# Patient Record
Sex: Female | Born: 1985 | Race: Black or African American | Hispanic: No | Marital: Single | Smoking: Never smoker
Health system: Southern US, Community
[De-identification: ages and names within clinical notes are randomized; demographics above are authoritative.]

---

## 2013-05-16 ENCOUNTER — Encounter (HOSPITAL_COMMUNITY): Payer: Self-pay | Admitting: Emergency Medicine

## 2013-05-16 ENCOUNTER — Emergency Department (HOSPITAL_COMMUNITY)
Admission: EM | Admit: 2013-05-16 | Discharge: 2013-05-16 | Disposition: A | Discharge status: Home / Self Care | Payer: Self-pay | Attending: Emergency Medicine | Admitting: Emergency Medicine

## 2013-05-16 ENCOUNTER — Emergency Department (HOSPITAL_COMMUNITY): Payer: Self-pay

## 2013-05-16 DIAGNOSIS — S8990XA Unspecified injury of unspecified lower leg, initial encounter: Secondary | ICD-10-CM | POA: Insufficient documentation

## 2013-05-16 DIAGNOSIS — M79671 Pain in right foot: Secondary | ICD-10-CM

## 2013-05-16 DIAGNOSIS — S99919A Unspecified injury of unspecified ankle, initial encounter: Principal | ICD-10-CM

## 2013-05-16 DIAGNOSIS — Y939 Activity, unspecified: Secondary | ICD-10-CM | POA: Insufficient documentation

## 2013-05-16 DIAGNOSIS — Y929 Unspecified place or not applicable: Secondary | ICD-10-CM | POA: Insufficient documentation

## 2013-05-16 DIAGNOSIS — S99929A Unspecified injury of unspecified foot, initial encounter: Principal | ICD-10-CM

## 2013-05-16 DIAGNOSIS — IMO0002 Reserved for concepts with insufficient information to code with codable children: Secondary | ICD-10-CM | POA: Insufficient documentation

## 2013-05-16 MED ORDER — IBUPROFEN 800 MG PO TABS: 800.0000 mg | ORAL_TABLET | Freq: Three times a day (TID) | ORAL | Status: AC | PRN

## 2013-05-16 MED ORDER — IBUPROFEN 800 MG PO TABS
800.0000 mg | ORAL_TABLET | Freq: Once | ORAL | Status: AC
  Administered 2013-05-16: 800 mg via ORAL
  Filled 2013-05-16: qty 1
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
## 2013-05-16 NOTE — Discharge Instructions (Signed)
Read the information below.  Use the prescribed medication as directed.  Please discuss all new medications with your pharmacist.  You may return to the Emergency Department at any time for worsening condition or any new symptoms that concern you.  If there is any possibility that you might be pregnant, please let your health care provider know and discuss this with the pharmacist to ensure medication safety.  If you develop uncontrolled pain, weakness or numbness of the extremity, severe discoloration of the skin, or you are unable to walk or move your foot, return to the ER for a recheck.      Musculoskeletal Pain Musculoskeletal pain is muscle and boney aches and pains. These pains can occur in any part of the body. Your caregiver may treat you without knowing the cause of the pain. They may treat you if blood or urine tests, X-rays, and other tests were normal.  CAUSES There is often not a definite cause or reason for these pains. These pains may be caused by a type of germ (virus). The discomfort may also come from overuse. Overuse includes working out too hard when your body is not fit. Boney aches also come from weather changes. Bone is sensitive to atmospheric pressure changes. HOME CARE INSTRUCTIONS   Ask when your test results will be ready. Make sure you get your test results.  Only take over-the-counter or prescription medicines for pain, discomfort, or fever as directed by your caregiver. If you were given medications for your condition, do not drive, operate machinery or power tools, or sign legal documents for 24 hours. Do not drink alcohol. Do not take sleeping pills or other medications that may interfere with treatment.  Continue all activities unless the activities cause more pain. When the pain lessens, slowly resume normal activities. Gradually increase the intensity and duration of the activities or exercise.  During periods of severe pain, bed rest may be helpful. Lay or sit in  any position that is comfortable.  Putting ice on the injured area.  Put ice in a bag.  Place a towel between your skin and the bag.  Leave the ice on for 15 to 20 minutes, 3 to 4 times a day.  Follow up with your caregiver for continued problems and no reason can be found for the pain. If the pain becomes worse or does not go away, it may be necessary to repeat tests or do additional testing. Your caregiver may need to look further for a possible cause. SEEK IMMEDIATE MEDICAL CARE IF:  You have pain that is getting worse and is not relieved by medications.  You develop chest pain that is associated with shortness or breath, sweating, feeling sick to your stomach (nauseous), or throw up (vomit).  Your pain becomes localized to the abdomen.  You develop any new symptoms that seem different or that concern you. MAKE SURE YOU:   Understand these instructions.  Will watch your condition.  Will get help right away if you are not doing well or get worse. Document Released: 03/12/2005 Document Revised: 06/04/2011 Document Reviewed: 11/14/2012 [facility] Patient Information 2014 [place], [place].    Emergency Department Resource Guide 1) Find a Doctor and Pay Out of Pocket Although you won't have to find out who is covered by your insurance plan, it is a good idea to ask around and get recommendations. You will then need to call the office and see if the doctor you have chosen will accept you as a new patient  and what types of options they offer for patients who are self-pay. Some doctors offer discounts or will set up payment plans for their patients who do not have insurance, but you will need to ask so you aren't surprised when you get to your appointment.  2) Contact Your Local Health Department Not all health departments have doctors that can see patients for sick visits, but many do, so it is worth a call to see if yours does. If you don't know where your local health department is,  you can check in your phone book. The CDC also has a tool to help you locate your state's health department, and many state websites also have listings of all of their local health departments.  3) Find a [facility] If your illness is not likely to be very severe or complicated, you may want to try a walk in clinic. These are popping up all over the country in pharmacies, drugstores, and shopping centers. They're usually staffed by nurse practitioners or physician assistants that have been trained to treat common illnesses and complaints. They're usually fairly quick and inexpensive. However, if you have serious medical issues or chronic medical problems, these are probably not your best option.  No Primary Care Doctor: - Call Health Connect at  [phone] - they can help you locate a primary care doctor that  accepts your insurance, provides certain services, etc. - Physician Referral Service- [phone]  Chronic Pain Problems: Organization         Address  Phone   Notes  [facility]  [phone] Patients need to be referred by their primary care doctor.   Medication Assistance: Organization         Address  Phone   Notes  [facility] Medication [facility] [address] [place], [place] [address] [phone] --Must be a resident of [facility] -- Must have NO insurance coverage whatsoever (no Medicaid/ Medicare, etc.) -- The pt. MUST have a primary care doctor that directs their care regularly and follows them in the community   [facility]  [phone]   [employer]  [phone]    Agencies that provide inexpensive medical care: Organization         Address  Phone   Notes  [place]  [phone]   [facility]    [phone]   [facility] [place], [place] [address] [phone]   [facility] [address] [place] [phone]   [facility]anned [facility]    [phone]   [facility]    [phone]   [place] [address], [place] Phone:  [phone], Fax:  [phone] Hours of Operation:  9 am - 6 pm, M-F.  Also accepts Medicaid/Medicare and self-pay.  [facility] [place] [address], [place] Phone: [phone], Fax: [phone]. Hours of Operation:  8:30 am - 5:30 pm, M-F.  Also accepts Medicaid and self-pay.  [facility] [place] [address], [place] Phone: [phone]   [place], [place], [place] [phone], Ext. [phone] Mondays & Thursdays: 7-9 AM.  First 15 patients are seen on a first come, first serve basis.    [facility] Providers:  Patent attorney  Notes  °[facility] [address], [place] ([phone] Also accepts self-pay patients.  °[facility] [address], [place] ° ([phone]) [phone]   °[facility] [address], [place] (336) [phone]   °[facility] [address] [place] [address], [place] (336) [phone]   °Veita Bland [address], [place]  ° ([phone]) [phone] Only accepts [place] Access Medicaid patients after they have their name applied to their card.  ° °Self-Pay (no insurance) in [place]: ° °Organization         Address  Phone   Notes  °Sickle Cell Patients, [facility] [address], [place] (336) [phone]   °[facility] [address], [place] (336) 832-4400   °[facility] [place] ° [address], [place] (336) [phone]   °[facility]/Dr. Osei-Bonsu ° [address] [place] [address], [place] or [address], [place] ([phone]) [phone] Phone number for both [place] and [place] locations  is the same.  °[facility] [address], [place] (336) [phone]   °[facility] [address] [place] [address], [place] or [address] (336) 852-7530 °(336) 878-2260   °[facility] [address], [place] (336) 350-1642, phone; ([phone]) [phone], fax Sees patients 1st and 3rd Saturday of every month.  Must not qualify for public or private insurance (i.e. Medicaid, Medicare, [place] Health Choice, Veterans' Benefits) • Household income should be no more than 200% of the poverty level •The clinic cannot treat you if you are pregnant or think you are pregnant • Sexually transmitted diseases are not treated at the clinic.  ° ° °Dental Care: °Organization         Address  Phone  Notes  °[facility] [facility] [address], [place] (336) [phone] Accepts children up to age 21 who are enrolled in Medicaid or [place] Health Choice; pregnant women with a Medicaid card; and children who have applied for Medicaid or [place] Health Choice, but were declined, whose parents can pay a reduced fee at time of service.  °[facility] [place]  [address], [place] (336) 641-7733 Accepts children up to age 21 who are enrolled in Medicaid or [place] Health Choice; pregnant women with a Medicaid card; and children who have applied for Medicaid or [place] Health Choice, but were declined, whose parents can pay a reduced fee at time of service.  °[facility] ° [address], [place] (336) [phone] Patients are seen by appointment only. Walk-ins are not accepted. [facility] will see patients 18 years of age and older. °Monday - Tuesday (8am-5pm) °Most Wednesdays (8:30-5pm) °$30 per visit, cash only  °[facility] ° [address], [place] (336) [phone] Patients are seen by appointment only. Walk-ins are not accepted. [facility] will see  patients 18 years of age and older. °One Wednesday Evening (Monthly: Volunteer Based).  $30 per visit, cash only  °[facility]  ([phone]) [phone] for adults; Children under age 4, call Graduate Pediatric Dentistry at [phone]. Children aged 4-14, please call [phone]) [phone] to request a pediatric application. ° Dental services are provided in all areas of dental care including fillings, crowns and bridges, complete   and partial dentures, implants, gum treatment, root canals, and extractions. Preventive care is also provided. Treatment is provided to both adults and children. °Patients are selected via a lottery and there is often a waiting list. °  °[facility] [address], °[place] ° (336) 763-8833 [email or web address] °  °[facility] [address], [place], [place] (336)723-1848, Ext. 123 Second and Fourth Thursday of each month, opens at 6:30 AM; Clinic ends at 9 AM.  Patients are seen on a first-come first-served basis, and a limited number are seen during each clinic.  ° °[facility] ° [address], [place], [place] (336) [phone]   Eligibility Requirements °You must have lived in [place], [place], or [place] counties for at least the last three months. °  You cannot be eligible for state or federal sponsored healthcare insurance, including Veterans Administration, Medicaid, or Medicare. °  You generally cannot be eligible for healthcare insurance through your employer.  °  How to apply: °Eligibility screenings are held every Tuesday and Wednesday afternoon from 1:00 pm until 4:00 pm. You do not need an appointment for the interview!  °[facility] [address], [place], [place] 336-631-2330   °[facility]  336-342-8273   °[facility]  336-703-3100   °[facility]  336-570-6415   ° °Behavioral Health Resources in the Community: °Intensive Outpatient  Programs °Organization         Address  Phone  Notes  °[facility] [address], [place], [place] 336-878-6098   °[facility] [address], [place], [place] 336-832-9800   °[facility] [address], [place], [place] ° 336-882-2125   °[facility] [address],  °[place], [place] 1-800-853-5163 or 336-641-4981   °Substance Abuse Resources °Organization         Address  Phone  Notes  °[facility]  336-882-2125   °[facility]  336-784-9470   °[facility]  336-285-9073   °[facility]  336-845-3988   °Residential & Outpatient Substance Abuse Program  1-800-659-3381   °Psychological Services °Organization         Address  Phone  Notes  ° Health  336- 832-9600   °[facility]  336- 378-7881   °[facility] [address], [place] 1-800-853-5163 or 336-641-4981   ° °Mobile Crisis Teams °Organization         Address  Phone  Notes  °[facility], Mobile Crisis Care Unit  1-877-626-1772   °Assertive °[facility] ° [address] [place], [place] 336-834-9664   °Sharon DeEsch [address] °[place] 336-554-5454   ° °Self-Help/Support Groups °Organization         Address  Phone             Notes  °[facility] [place] - variety of support groups  336- 373-1402 Call for more information  °Narcotics Anonymous (NA), [facility] [address], °[place]  2 meetings at this location  ° °Residential Treatment Programs °Organization         Address  Phone  Notes  °[facility] [address],    ° [place]  1-866-801-8205   °[facility] ° [address], [place],  704-293-8524   °[facility] [address], [place] 336-845-3988 Admissions: 8am-3pm M-F  °  [facility] [address]    °[place], [place]  336-841-1104   °[facility] [address], [place], [place] 336-379-7146   °[facility] [address]  °[place], [place] 336-285-9073   °[facility] - Intensive Outpatient [address], [place], [place] 336-852-3033   °[facility] [address]  °[place], [place] 1-877-615-2722 or 336-784-9470   °[facility] ([facility]) [address] [place], [place] 336-227-7417 Accepts Medicaid  °[facility] [address]  °[place] 1-800-659-3381 Substance Abuse/Addiction Treatment  ° °[place] Behavioral Health Resources °Organization         Address  Phone  Notes  °[facility]  (888) 581-9988   °Julie Brannon, PhD [address] [place], [place]   (336) 349-5553 or (336) 951-0000   ° Behavioral   [address] °[place], [place] (336) 349-4454   °[facility] [address], [place], [place] (336) 342-8316 Insurance/Medicaid/sponsorship through [facility]  °[facility] [address]                                    [place], [place] (336) 342-8316 Therapy/tele-psych/case  °[facility] [address]  ° [place],  (336) 349-2233    °Dr. Arfeen  (336) 349-4544   °[facility] [place]  [employer] [facility] [facility] 1) [address], [place] °2) [address], [place] °3)  [address], [place] (336) 349-3220 °(336) 342-7768 ° °(336) 342-8140   °[place] Child Abuse Hotline (336) 342-1394 or (336) 342-3537 (After Hours)    ° ° ° °

## 2013-05-17 NOTE — ED Provider Notes (Signed)
Medical screening examination/treatment/procedure(s) were performed by non-physician practitioner and as supervising physician I was immediately available for consultation/collaboration.  EKG Interpretation   None        Raeford RazorStephen Tahiri Shareef, MD 05/17/13 [phone]

## 2013-09-01 ENCOUNTER — Encounter (HOSPITAL_COMMUNITY): Payer: Self-pay | Admitting: Emergency Medicine

## 2013-09-01 ENCOUNTER — Emergency Department (INDEPENDENT_AMBULATORY_CARE_PROVIDER_SITE_OTHER)
Admission: EM | Admit: 2013-09-01 | Discharge: 2013-09-01 | Disposition: A | Discharge status: Home / Self Care | Payer: Self-pay | Source: Home / Self Care | Attending: Emergency Medicine | Admitting: Emergency Medicine

## 2013-09-01 DIAGNOSIS — M109 Gout, unspecified: Secondary | ICD-10-CM

## 2013-09-01 MED ORDER — INDOMETHACIN 25 MG PO CAPS: 25.0000 mg | ORAL_CAPSULE | Freq: Three times a day (TID) | ORAL | Status: AC | PRN

## 2013-09-01 NOTE — ED Provider Notes (Signed)
Medical screening examination/treatment/procedure(s) were performed by non-physician practitioner and as supervising physician I was immediately available for consultation/collaboration.  Leslee Home, M.D.  Reuben Likes, MD 09/01/13 [phone]

## 2013-09-01 NOTE — ED Provider Notes (Signed)
CSN: [id number]     Arrival date & time 09/01/13  7253 History   First MD Initiated Contact with Patient 09/01/13 [phone]     Chief Complaint  Patient presents with  . Foot Pain   (Consider location/radiation/quality/duration/timing/severity/associated sxs/prior Treatment) HPI Comments: No known injury. Has pain at the base of the left great toe and this radiates to forefoot with weight bearing or ambulation. Reports a similar episode about 2 months ago at the base of the right great toe, States the previous episode resolved with use of 3-5 days of ibuprofen.  No fever/chills No known hx of gout  Patient is a 28 y.o. female presenting with lower extremity pain. The history is provided by the patient.  Foot Pain This is a new problem. Episode onset: one week ago. The problem occurs constantly. The problem has not changed since onset.   History reviewed. No pertinent past medical history. History reviewed. No pertinent past surgical history. History reviewed. No pertinent family history. History  Substance Use Topics  . Smoking status: Never Smoker   . Smokeless tobacco: Not on file  . Alcohol Use: No   OB History   Grav Para Term Preterm Abortions TAB SAB Ect Mult Living                 Review of Systems  All other systems reviewed and are negative.   Allergies  Review of patient's allergies indicates no known allergies.  Home Medications   Prior to Admission medications   Medication Sig Start Date End Date Taking? Authorizing Provider  ibuprofen (ADVIL,MOTRIN) 200 MG tablet Take 200 mg by mouth every 6 (six) hours as needed for mild pain or moderate pain.    Historical Provider, MD  ibuprofen (ADVIL,MOTRIN) 800 MG tablet Take 1 tablet (800 mg total) by mouth every 8 (eight) hours as needed for mild pain or moderate pain. 05/16/13   Trixie Dredge, PA-C  indomethacin (INDOCIN) 25 MG capsule Take 1 capsule (25 mg total) by mouth 3 (three) times daily as needed for mild pain or  moderate pain (take with a meal). 09/01/13   Jess Barters [place] Soy, PA   BP 125/79  Pulse 115  Temp(Src) 97.9 F (36.6 C) (Oral)  Resp 20  SpO2 100%  LMP 08/06/2013 Physical Exam  Nursing note and vitals reviewed. Constitutional: She is oriented to person, place, and time. She appears well-developed and well-nourished. No distress.  +morbidly obese  HENT:  Head: Normocephalic and atraumatic.  Eyes: Conjunctivae are normal. No scleral icterus.  Cardiovascular: Regular rhythm.   +mild tachycardia  Pulmonary/Chest: Effort normal.  Musculoskeletal:       Right foot: Normal. She exhibits no bony tenderness, normal capillary refill, no crepitus, no deformity and no laceration.       Left foot: She exhibits decreased range of motion, tenderness and swelling. She exhibits no bony tenderness, normal capillary refill, no crepitus, no deformity and no laceration.       Feet:  ROM only limited by discomfort at left foot. CSM exam intact  Neurological: She is alert and oriented to person, place, and time.  Skin: Skin is warm and dry.  Psychiatric: She has a normal mood and affect. Her behavior is normal.    ED Course  Procedures (including critical care time) Labs Review Labs Reviewed - No data to display  Imaging Review No results found.   MDM   1. Podagra   Likely gout. Advised PCP follow up at her next appointment in  July 2015. Indocin as prescribed with ice and elevation.     Jess BartersJennifer Lee RheemsPresson, GeorgiaPA 09/01/13 [phone]

## 2013-09-01 NOTE — Discharge Instructions (Signed)
Gout °Gout is an inflammatory arthritis caused by a buildup of uric acid crystals in the joints. Uric acid is a chemical that is normally present in the blood. When the level of uric acid in the blood is too high it can form crystals that deposit in your joints and tissues. This causes joint redness, soreness, and swelling (inflammation). Repeat attacks are common. Over time, uric acid crystals can form into masses (tophi) near a joint, destroying bone and causing disfigurement. Gout is treatable and often preventable. °CAUSES  °The disease begins with elevated levels of uric acid in the blood. Uric acid is produced by your body when it breaks down a naturally found substance called purines. Certain foods you eat, such as meats and fish, contain high amounts of purines. Causes of an elevated uric acid level include: °· Being passed down from parent to child (heredity). °· Diseases that cause increased uric acid production (such as obesity, psoriasis, and certain cancers). °· Excessive alcohol use. °· Diet, especially diets rich in meat and seafood. °· Medicines, including certain cancer-fighting medicines (chemotherapy), water pills (diuretics), and aspirin. °· Chronic kidney disease. The kidneys are no longer able to remove uric acid well. °· Problems with metabolism. °Conditions strongly associated with gout include: °· Obesity. °· High blood pressure. °· High cholesterol. °· Diabetes. °Not everyone with elevated uric acid levels gets gout. It is not understood why some people get gout and others do not. Surgery, joint injury, and eating too much of certain foods are some of the factors that can lead to gout attacks. °SYMPTOMS  °· An attack of gout comes on quickly. It causes intense pain with redness, swelling, and warmth in a joint. °· Fever can occur. °· Often, only one joint is involved. Certain joints are more commonly involved: °· Base of the big toe. °· Knee. °· Ankle. °· Wrist. °· Finger. °Without  treatment, an attack usually goes away in a few days to weeks. Between attacks, you usually will not have symptoms, which is different from many other forms of arthritis. °DIAGNOSIS  °Your caregiver will suspect gout based on your symptoms and exam. In some cases, tests may be recommended. The tests may include: °· Blood tests. °· Urine tests. °· X-rays. °· Joint fluid exam. This exam requires a needle to remove fluid from the joint (arthrocentesis). Using a microscope, gout is confirmed when uric acid crystals are seen in the joint fluid. °TREATMENT  °There are two phases to gout treatment: treating the sudden onset (acute) attack and preventing attacks (prophylaxis). °· Treatment of an Acute Attack. °· Medicines are used. These include anti-inflammatory medicines or steroid medicines. °· An injection of steroid medicine into the affected joint is sometimes necessary. °· The painful joint is rested. Movement can worsen the arthritis. °· You may use warm or cold treatments on painful joints, depending which works best for you. °· Treatment to Prevent Attacks. °· If you suffer from frequent gout attacks, your caregiver may advise preventive medicine. These medicines are started after the acute attack subsides. These medicines either help your kidneys eliminate uric acid from your body or decrease your uric acid production. You may need to stay on these medicines for a very long time. °· The early phase of treatment with preventive medicine can be associated with an increase in acute gout attacks. For this reason, during the first few months of treatment, your caregiver may also advise you to take medicines usually used for acute gout treatment. Be sure you   understand your caregiver's directions. Your caregiver may make several adjustments to your medicine dose before these medicines are effective. °· Discuss dietary treatment with your caregiver or dietitian. Alcohol and drinks high in sugar and fructose and foods  such as meat, poultry, and seafood can increase uric acid levels. Your caregiver or dietician can advise you on drinks and foods that should be limited. °HOME CARE INSTRUCTIONS  °· Do not take aspirin to relieve pain. This raises uric acid levels. °· Only take over-the-counter or prescription medicines for pain, discomfort, or fever as directed by your caregiver. °· Rest the joint as much as possible. When in bed, keep sheets and blankets off painful areas. °· Keep the affected joint raised (elevated). °· Apply warm or cold treatments to painful joints. Use of warm or cold treatments depends on which works best for you. °· Use crutches if the painful joint is in your leg. °· Drink enough fluids to keep your urine clear or pale yellow. This helps your body get rid of uric acid. Limit alcohol, sugary drinks, and fructose drinks. °· Follow your dietary instructions. Pay careful attention to the amount of protein you eat. Your daily diet should emphasize fruits, vegetables, whole grains, and fat-free or low-fat milk products. Discuss the use of coffee, vitamin C, and cherries with your caregiver or dietician. These may be helpful in lowering uric acid levels. °· Maintain a healthy body weight. °SEEK MEDICAL CARE IF:  °· You develop diarrhea, vomiting, or any side effects from medicines. °· You do not feel better in 24 hours, or you are getting worse. °SEEK IMMEDIATE MEDICAL CARE IF:  °· Your joint becomes suddenly more tender, and you have chills or a fever. °MAKE SURE YOU:  °· Understand these instructions. °· Will watch your condition. °· Will get help right away if you are not doing well or get worse. °Document Released: 03/09/2000 Document Revised: 07/07/2012 Document Reviewed: 10/24/2011 °[employer]® Patient Information ©2014 [employer]. ° °Purine Restricted Diet °A low-purine diet consists of foods that reduce uric acid made in your body. °INDICATIONS FOR USE  °Your caregiver may ask you to follow a low-purine  diet to reduce gout flairs.  °GUIDELINES  °Avoid high-purine foods, including all alcohol, yeast extracts taken as supplements, and sauces made from meats (like gravy). Do not eat high-purine meats, including anchovies, sardines, herring, mussels, tuna, codfish, scallops, trout, haddock, bacon, organ meats, tripe, goose, wild game, and sweetbreads.  °Grains °· Allowed/Recommended: All, except those listed to consume in moderation. °· Consume in Moderation: Oatmeal ( cup uncooked daily), wheat bran or germ (¼ cup daily), and whole grains. °Vegetables °· Allowed/Recommended: All, except those listed to consume in moderation. °· Consume in Moderation: Asparagus, cauliflower, spinach, mushrooms, and green peas (½ cup daily). °Fruit °· Allowed/Recommended: All. °· Consume in Moderation: None. °Meat and Meat Substitutes °· Allowed/Recommended: Eggs, nuts, and peanut butter. °· Consume in Moderation: Limit to 4 to 6 oz daily. Avoid high-purine meats. Lentils, peas, and dried beans (1 cup daily). °Milk °· Allowed/Recommended: All. Choose low-fat or skim when possible. °· Consume in Moderation: None. °Fats and Oils °· Allowed/Recommended: All. °· Consume in Moderation: None. °Beverages °· Allowed/Recommended: All, except those listed to avoid. °· Avoid: All alcohol. °Condiments/Miscellaneous °· Allowed/Recommended: All, except those listed to consume in moderation. °· Consume in Moderation: Bouillon and meat-based broths and soups. °Document Released: 07/07/2010 Document Revised: 06/04/2011 Document Reviewed: 07/07/2010 °[employer]® Patient Information ©2014 [employer]. ° °

## 2013-09-01 NOTE — ED Notes (Signed)
Pt  Reports  P[ain /  Swelling  Of  The  l  Foot  denys  Any  Injury    Also  Reports  Some  Discoloration

## 2015-05-21 IMAGING — CR DG FOOT COMPLETE 3+V*R*
3 series · 3 of 3 positions shown · non-contrast
Comparison: None.

CLINICAL DATA: Foot injury, lateral pain and swelling

EXAM:
RIGHT FOOT COMPLETE - 3+ VIEW

[x foot ap right]
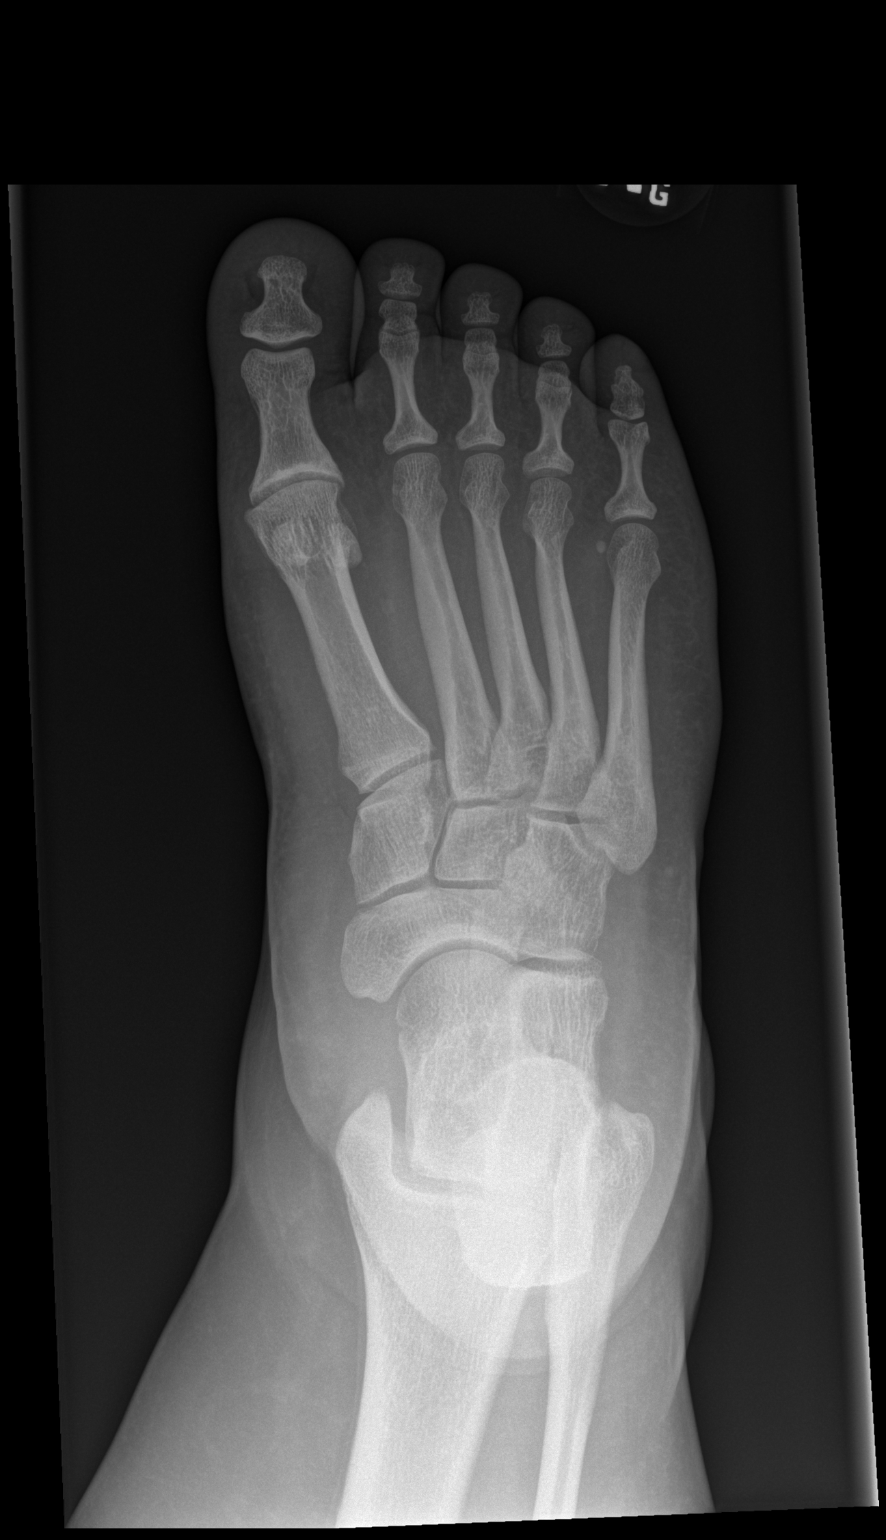

[x foot obl right]
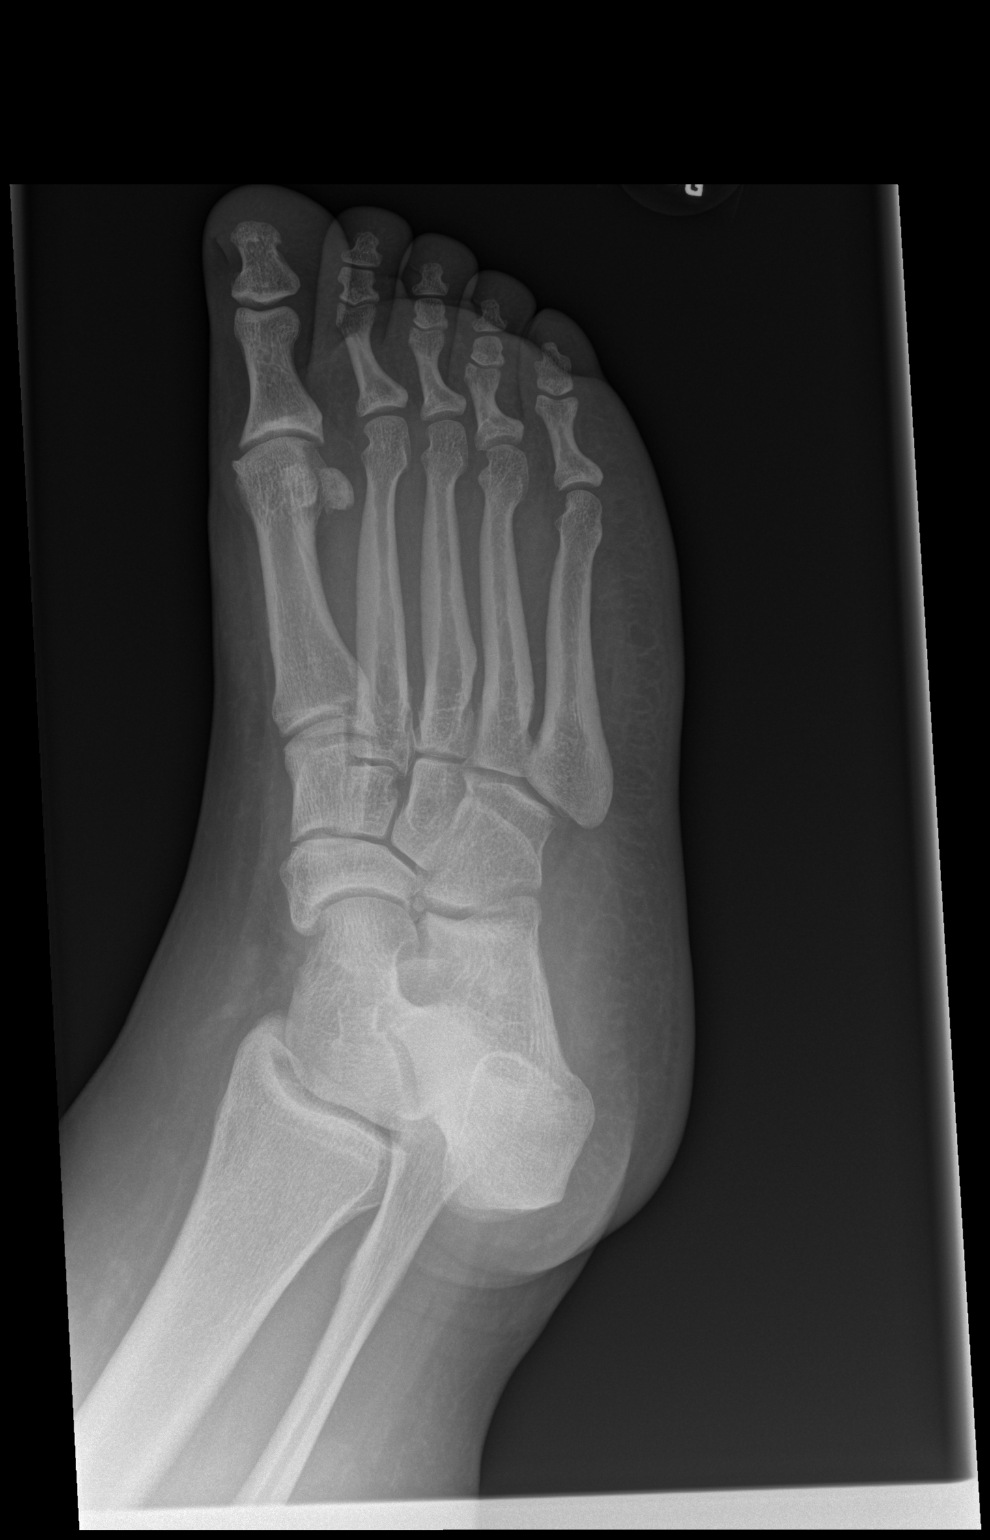

[x foot lat right]
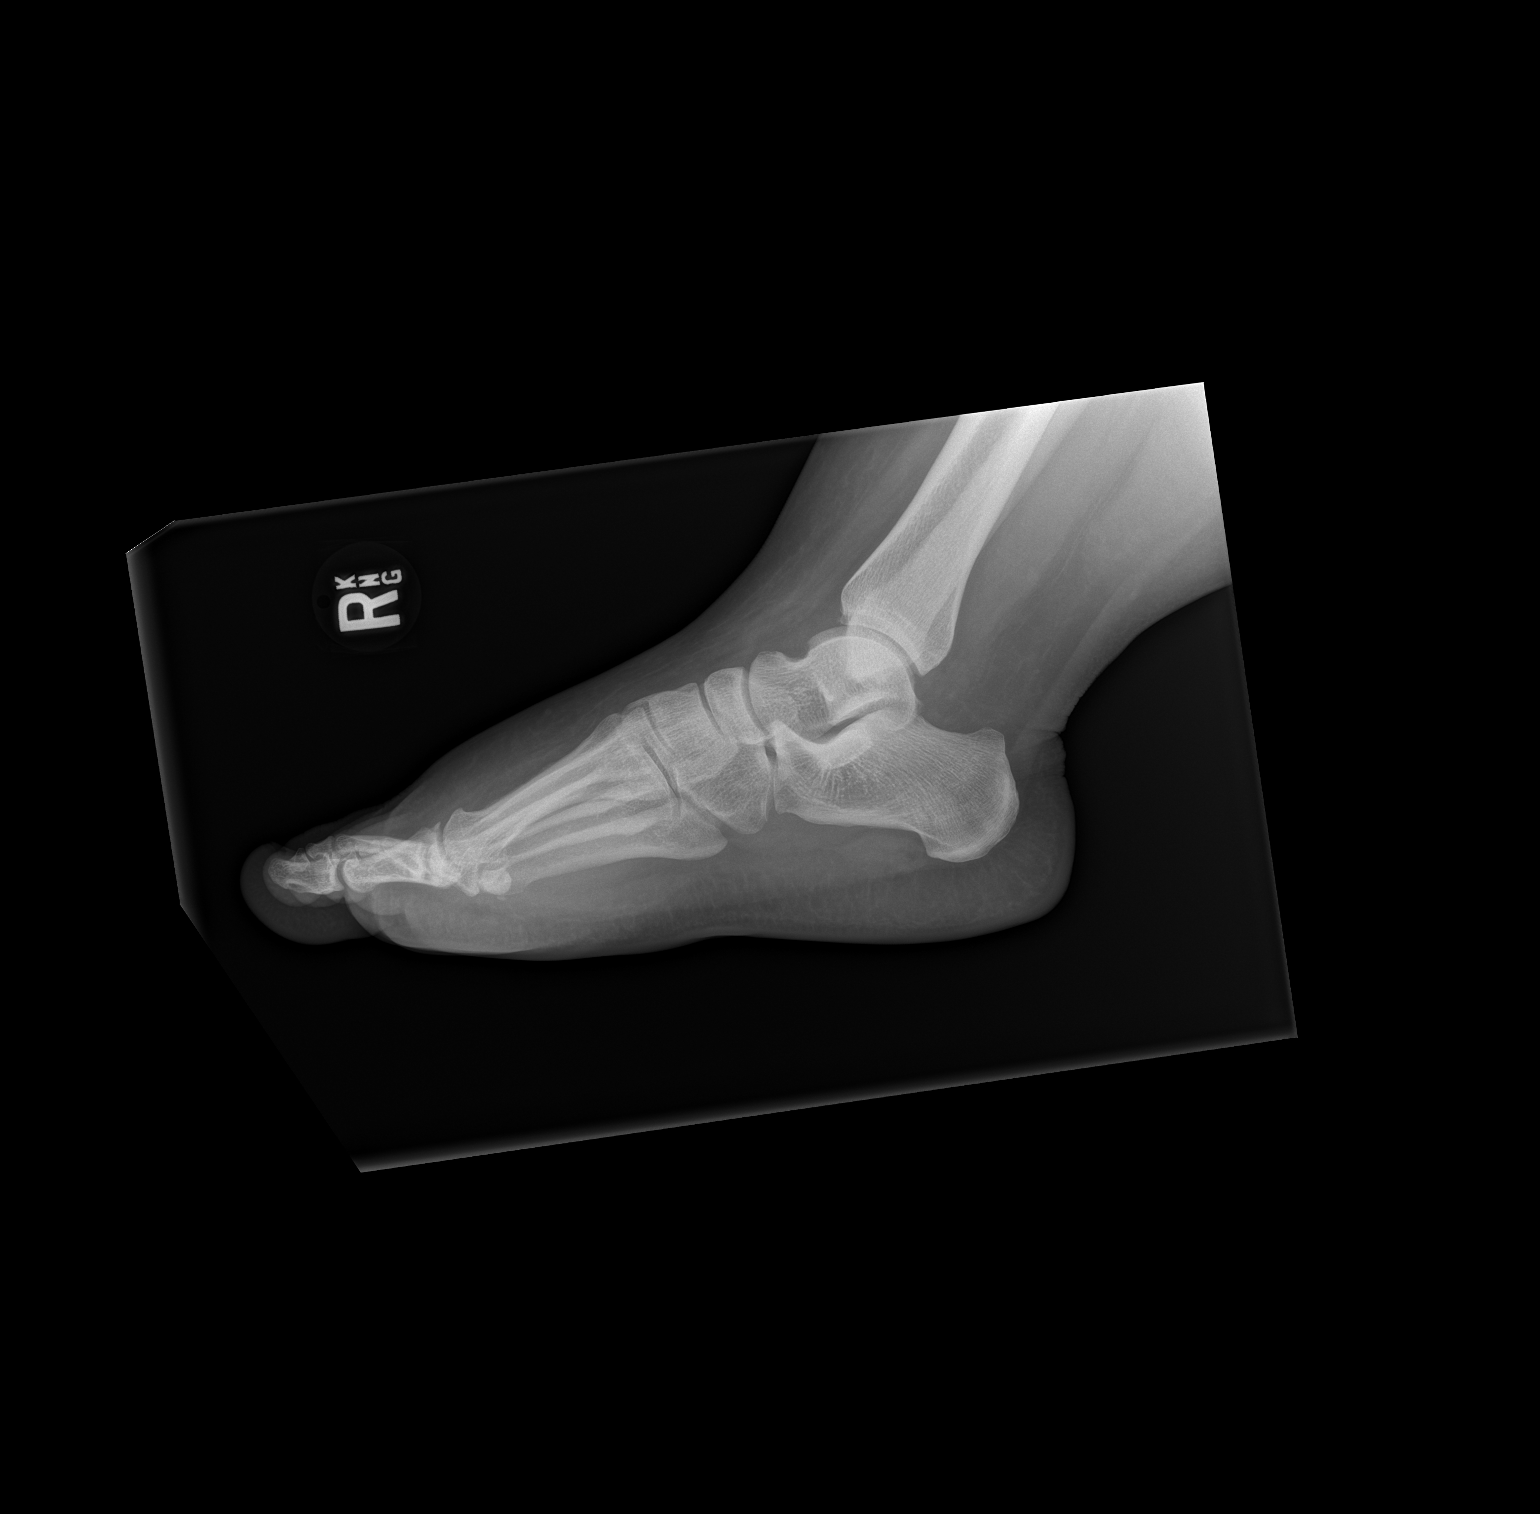

[3 of 3 positions shown; findings below may reference images not displayed]

FINDINGS: Early degenerative change of the right first MTP joint with joint
space loss, spurring and sclerosis. Normal alignment. No acute
fracture. Other joint spaces preserved.
IMPRESSION: No acute osseous finding

Right first MTP joint osteoarthritis.
